# Patient Record
Sex: Male | Born: 1940 | Race: White | Hispanic: No | Marital: Married | Smoking: Former smoker
Health system: Southern US, Community
[De-identification: ages and names within clinical notes are randomized; demographics above are authoritative.]

## PROBLEM LIST (undated history)

## (undated) DIAGNOSIS — I10 Essential (primary) hypertension: Secondary | ICD-10-CM

## (undated) DIAGNOSIS — E7800 Pure hypercholesterolemia, unspecified: Secondary | ICD-10-CM

## (undated) DIAGNOSIS — C61 Malignant neoplasm of prostate: Secondary | ICD-10-CM

## (undated) DIAGNOSIS — N182 Chronic kidney disease, stage 2 (mild): Secondary | ICD-10-CM

## (undated) HISTORY — DX: Malignant neoplasm of prostate: C61

## (undated) HISTORY — DX: Chronic kidney disease, stage 2 (mild): N18.2

## (undated) HISTORY — PX: HERNIA REPAIR: SHX51

## (undated) HISTORY — PX: CATARACT EXTRACTION: SUR2

## (undated) HISTORY — PX: PROSTATE BIOPSY: SHX241

---

## 2001-06-19 ENCOUNTER — Ambulatory Visit (HOSPITAL_COMMUNITY): Admission: RE | Admit: 2001-06-19 | Discharge: 2001-06-19 | Payer: Self-pay | Admitting: Gastroenterology

## 2009-01-03 ENCOUNTER — Encounter (INDEPENDENT_AMBULATORY_CARE_PROVIDER_SITE_OTHER): Payer: Self-pay | Admitting: General Surgery

## 2009-01-03 ENCOUNTER — Ambulatory Visit (HOSPITAL_COMMUNITY): Admission: RE | Admit: 2009-01-03 | Discharge: 2009-01-03 | Payer: Self-pay | Admitting: General Surgery

## 2010-07-20 ENCOUNTER — Other Ambulatory Visit: Payer: Self-pay | Admitting: Family Medicine

## 2010-08-29 LAB — COMPREHENSIVE METABOLIC PANEL
ALT: 30 U/L (ref 0–53)
AST: 25 U/L (ref 0–37)
Alkaline Phosphatase: 48 U/L (ref 39–117)
CO2: 28 mEq/L (ref 19–32)
Chloride: 104 mEq/L (ref 96–112)
GFR calc Af Amer: >60 mL/min (ref >60)
GFR calc non Af Amer: >60 mL/min (ref >60)
Glucose, Bld: 117 mg/dL — ABNORMAL HIGH (ref 70–99)
Sodium: 140 mEq/L (ref 135–145)
Total Bilirubin: 0.7 mg/dL (ref 0.3–1.2)

## 2010-08-29 LAB — CBC
Hemoglobin: 13 g/dL (ref 13.0–17.0)
RBC: 4.2 MIL/uL — ABNORMAL LOW (ref 4.22–5.81)
WBC: 7 10*3/uL (ref 4.0–10.5)

## 2010-08-29 LAB — DIFFERENTIAL
Basophils Absolute: 0.1 10*3/uL (ref 0.0–0.1)
Basophils Relative: 1 % (ref 0–1)
Eosinophils Absolute: 0.3 10*3/uL (ref 0.0–0.7)
Eosinophils Relative: 5 % (ref 0–5)
Lymphs Abs: 1.5 10*3/uL (ref 0.7–4.0)
Neutrophils Relative %: 67 % (ref 43–77)

## 2010-08-29 LAB — PROTIME-INR: Prothrombin Time: 13.6 seconds (ref 11.6–15.2)

## 2010-10-06 NOTE — Op Note (Signed)
NAME:  JAMARL, PEW NO.:  [id number]   MEDICAL RECORD NO.:  [id number]          PATIENT TYPE:  AMB   LOCATION:  DAY                          FACILITY:  [facility]   PHYSICIAN:  Adolph Pollack, M.D.DATE OF BIRTH:  [birth year]   DATE OF PROCEDURE:  01/03/2009  DATE OF DISCHARGE:                               OPERATIVE REPORT   PREOPERATIVE DIAGNOSIS:  Umbilical hernia.   POSTOPERATIVE DIAGNOSIS:  Umbilical hernia.   PROCEDURE:  Umbilical hernia repair with mesh.   SURGEON:  Adolph Pollack, M.D.   ANESTHESIA:  General plus Marcaine local.   INDICATIONS:  This is a 70 year old male who has had a progressively  enlarging umbilical hernia that is somewhat uncomfortable at times.  He  now presents for repair.  The procedure risks and aftercare were  discussed with him preoperatively.   PROCEDURE IN DETAIL:  He was brought to the operating room, placed  supine on the operating table, general anesthetic was administered.  Hair on the abdominal wall was clipped and the area was sterilely  prepped and draped.  I was able to reduce the hernia.  I injected  Marcaine superficially and deep in the periumbilical area.  A  curvilinear subumbilical incision was made through the skin,  subcutaneous tissue and fascia.  I identified the umbilical stalk and  the hernia involving it.  There was also firm hernia content fairly  adherent to the subcutaneous tissue near the umbilicus.  Using blunt  dissection I encircled the umbilical stalk and dissected it free from  the fascia exposing the hernia which measured about 2.5 to 3 cm.  I then  noticed that part of the hernia content and sac were fairly adherent to  the umbilical dermal tissue and dissected this free from that.  There  was a fairly firm mass present and it appeared to be somewhat lipomatous  in nature but also was fibrotic.  I excised some of the hernia sac as  well and sent both the hernia sac and this hard nodule  (hernia contents)  to pathology.   Following this I then raised subcutaneous flaps all around the area of  the hernia to allow for a 3 cm overlap of the mesh I was planning to  place.  I then primarily repaired the hernia under no tension with  interrupted zero Surgilon sutures leaving them long.  A piece of  polypropylene mesh was brought into the field and cut so I would have 3  cm of overlap in all directions.  The primary repair sutures were then  threaded through the mesh and tied down anchoring the mesh directly over  the primary repair.  Following this I then anchored the mesh to the  fascia around the primary repair with a running zero Prolene suture.  This provided for good overlap of the mesh.   I then irrigated the wound and hemostasis was adequate.  Local  anesthetic was infiltrated to the fascia.  The umbilicus was implanted  on the mesh with a 3-0 Vicryl suture.  The subcutaneous tissue was  closed over the mesh with  a running 3-0 Vicryl suture.  The skin was  closed with a running 4-0 Monocryl subcuticular stitch.  Steri-Strips  and sterile dressings were applied.   He tolerated the procedure well without any apparent complications and  was taken to recovery in satisfactory condition.      Adolph Pollack, M.D.  Electronically Signed     TJR/MEDQ  D:  01/03/2009  T:  01/03/2009  Job:  [id number]   cc:   Molly Maduro L. Foy [place], M.D.  Fax: [phone]

## 2010-10-09 NOTE — Procedures (Signed)
[facility]  Patient:    PINK, MAYE Visit Number: [id number] MRN: [id number]          Service Type: END Location: ENDO Attending Physician:  Louie Bun Dictated by:   Everardo All Madilyn Fireman, M.D. Proc. Date: 06/19/01 Admit Date:  06/19/2001   CC:         Marinda Elk, M.D.   Procedure Report  PROCEDURE:  Colonoscopy.  SURGEON:  John C. Madilyn Fireman, M.D.  INDICATIONS FOR PROCEDURE:  A 70 year old patient with no prior colon screening.  Referred for screening colonoscopy.  DESCRIPTION OF PROCEDURE:  The patient was placed in the left lateral decubitus position and placed on the pulse monitor with continuous low flow oxygen delivered by nasal cannula.  He was sedated with 60 mg of IV Demerol and 6 mg of IV Versed.  The Olympus video colonoscope was inserted into the rectum and advanced to the cecum, confirmed by transillumination of McBurneys point, and visualization of the ileocecal valve and appendiceal orifice.  The prep was excellent.  The cecum, ascending, transverse, descending, and sigmoid colon all appeared normal with no masses, polyps, diverticula, or other mucosal abnormalities.  The rectum likewise appeared normal.  Retroflexed view of the anus revealed no obvious internal hemorrhoids.  The colonoscope was then withdrawn and the patient returned to the recovery room in stable condition.  He tolerated the procedure well and there were no immediate complications.  IMPRESSION:  Essentially normal colonoscopy.  PLAN:  Consider repeat flexible sigmoidoscopy in five years and colonoscopy in 10 years. Dictated by:   Everardo All Madilyn Fireman, M.D. Attending Physician:  Louie Bun DD:  06/19/01 TD:  06/19/01 Job: [id number]

## 2011-07-14 ENCOUNTER — Other Ambulatory Visit: Payer: Self-pay | Admitting: Gastroenterology

## 2012-10-05 ENCOUNTER — Other Ambulatory Visit: Payer: Self-pay | Admitting: Family Medicine

## 2013-03-22 ENCOUNTER — Emergency Department (HOSPITAL_BASED_OUTPATIENT_CLINIC_OR_DEPARTMENT_OTHER)
Admission: EM | Admit: 2013-03-22 | Discharge: 2013-03-22 | Disposition: A | Discharge status: Home / Self Care | Payer: Medicare Other | Attending: Emergency Medicine | Admitting: Emergency Medicine

## 2013-03-22 ENCOUNTER — Encounter (HOSPITAL_BASED_OUTPATIENT_CLINIC_OR_DEPARTMENT_OTHER): Payer: Self-pay | Admitting: Emergency Medicine

## 2013-03-22 DIAGNOSIS — E7800 Pure hypercholesterolemia, unspecified: Secondary | ICD-10-CM | POA: Insufficient documentation

## 2013-03-22 DIAGNOSIS — I10 Essential (primary) hypertension: Secondary | ICD-10-CM | POA: Insufficient documentation

## 2013-03-22 DIAGNOSIS — R040 Epistaxis: Secondary | ICD-10-CM | POA: Insufficient documentation

## 2013-03-22 DIAGNOSIS — Z79899 Other long term (current) drug therapy: Secondary | ICD-10-CM | POA: Insufficient documentation

## 2013-03-22 HISTORY — DX: Essential (primary) hypertension: I10

## 2013-03-22 HISTORY — DX: Pure hypercholesterolemia, unspecified: E78.00

## 2013-03-22 MED ORDER — OXYMETAZOLINE HCL 0.05 % NA SOLN
NASAL | Status: AC
  Administered 2013-03-22: 07:00:00
  Filled 2013-03-22: qty 15

## 2013-03-22 NOTE — ED Notes (Signed)
MD at bedside. 

## 2013-03-22 NOTE — ED Notes (Signed)
Nosebleed off and on for past few days, denies injury.

## 2013-03-22 NOTE — ED Provider Notes (Signed)
CSN: [id number]     Arrival date & time 03/22/13  0610 History   None    Chief Complaint  Patient presents with  . Epistaxis   (Consider location/radiation/quality/duration/timing/severity/associated sxs/prior Treatment) HPI Comments: Patient is an otherwise healthy 72 year old male who presents to the emergency department with complaints of nosebleed off and on for the past several days. He denies any injury or trauma. It is not take any anticoagulants with the exception of a baby aspirin. He is not on Coumadin orders or xarelto. He denies any other bleeding issues are busy bruising.  Patient is a 72 y.o. male presenting with nosebleeds. The history is provided by the patient.  Epistaxis Location:  R nare Severity:  Moderate Duration:  3 days Timing:  Intermittent Progression:  Worsening Chronicity:  New Context: not anticoagulants and not trauma   Relieved by:  Nothing Worsened by:  Nothing tried   Past Medical History  Diagnosis Date  . Hypertension   . High cholesterol    Past Surgical History  Procedure Laterality Date  . Hernia repair     History reviewed. No pertinent family history. History  Substance Use Topics  . Smoking status: Never Smoker   . Smokeless tobacco: Not on file  . Alcohol Use: Yes    Review of Systems  HENT: Positive for nosebleeds.   All other systems reviewed and are negative.    Allergies  Review of patient's allergies indicates no known allergies.  Home Medications   Current Outpatient Rx  Name  Route  Sig  Dispense  Refill  . atorvastatin (LIPITOR) 10 MG tablet   Oral   Take 10 mg by mouth daily.         Marland Kitchen olmesartan-hydrochlorothiazide (BENICAR HCT) 40-25 MG per tablet   Oral   Take 1 tablet by mouth daily.          BP 162/87  Pulse 100  Temp(Src) 97.7 F (36.5 C) (Oral)  SpO2 95% Physical Exam  Nursing note and vitals reviewed. Constitutional: He is oriented to person, place, and time. He appears well-developed  and well-nourished. No distress.  HENT:  Head: Normocephalic and atraumatic.  Mouth/Throat: Oropharynx is clear and moist.  There is clotted blood in the right nare but no active bleeding. The left nares is clear.  Neck: Normal range of motion. Neck supple.  Cardiovascular: Normal rate and regular rhythm.   Pulmonary/Chest: Effort normal and breath sounds normal.  Musculoskeletal: Normal range of motion.  Lymphadenopathy:    He has cervical adenopathy.  Neurological: He is alert and oriented to person, place, and time.  Skin: Skin is warm and dry. He is not diaphoretic.    ED Course  Procedures (including critical care time) Labs Review Labs Reviewed - No data to display Imaging Review No results found.  EKG Interpretation   None       MDM  No diagnosis found. He was given Afrin nasal spray and a Vaseline gauze packing was placed. He tolerated this procedure well and hemostasis has been achieved thus far. He was observed for an appropriate period of time and had no further bleeding and appears well. He will be discharged to home with instructions to followup for packing removal in 24 hours. To return as needed if he experiences any problems.    Geoffery Lyons, MD 03/22/13 [phone]

## 2013-03-22 NOTE — ED Notes (Signed)
Packing inserted into right nare by Dr Judd Lien, pt tolerated well. No bleeding at this time.

## 2014-07-02 ENCOUNTER — Other Ambulatory Visit (HOSPITAL_COMMUNITY): Payer: Self-pay | Admitting: Family Medicine

## 2014-07-02 DIAGNOSIS — I739 Peripheral vascular disease, unspecified: Secondary | ICD-10-CM

## 2014-07-05 ENCOUNTER — Ambulatory Visit (HOSPITAL_COMMUNITY)
Admission: RE | Admit: 2014-07-05 | Discharge: 2014-07-05 | Disposition: A | Discharge status: Home / Self Care | Payer: Medicare Other | Source: Ambulatory Visit | Attending: Family Medicine | Admitting: Family Medicine

## 2014-07-05 DIAGNOSIS — Z79899 Other long term (current) drug therapy: Secondary | ICD-10-CM | POA: Diagnosis not present

## 2014-07-05 DIAGNOSIS — I10 Essential (primary) hypertension: Secondary | ICD-10-CM | POA: Diagnosis not present

## 2014-07-05 DIAGNOSIS — E785 Hyperlipidemia, unspecified: Secondary | ICD-10-CM | POA: Insufficient documentation

## 2014-07-05 DIAGNOSIS — I739 Peripheral vascular disease, unspecified: Secondary | ICD-10-CM | POA: Insufficient documentation

## 2014-07-05 NOTE — Progress Notes (Signed)
VASCULAR LAB PRELIMINARY  ARTERIAL  ABI completed:    RIGHT    LEFT    PRESSURE WAVEFORM  PRESSURE WAVEFORM  BRACHIAL 127 Triphasic BRACHIAL 126 Triphasic  DP 150 Triphasic DP 146 Triphasic  PT 147 Triphasic PT 144 Triphasic    RIGHT LEFT  ABI 1.18 1.15   ABIs and Doppler waveforms are within normal limits bilaterally.  Tachina Spoonemore, RVS 07/05/2014, 6:01 PM

## 2015-03-03 ENCOUNTER — Telehealth: Payer: Self-pay | Admitting: Cardiology

## 2015-03-03 NOTE — Telephone Encounter (Signed)
Received records from [facility] for appointment on 04/01/15 with Dr Antoine Poche.  Records given to [facility] (medical records) for Dr Lindaann Slough schedule on 04/01/15. lp

## 2015-04-01 ENCOUNTER — Ambulatory Visit (INDEPENDENT_AMBULATORY_CARE_PROVIDER_SITE_OTHER): Payer: Medicare Other | Admitting: Cardiology

## 2015-04-01 ENCOUNTER — Encounter: Payer: Self-pay | Admitting: Cardiology

## 2015-04-01 VITALS — BP 110/64 | HR 92 | Ht 67.0 in | Wt 254.9 lb

## 2015-04-01 DIAGNOSIS — E663 Overweight: Secondary | ICD-10-CM

## 2015-04-01 DIAGNOSIS — I10 Essential (primary) hypertension: Secondary | ICD-10-CM

## 2015-04-01 DIAGNOSIS — R0602 Shortness of breath: Secondary | ICD-10-CM | POA: Diagnosis not present

## 2015-04-01 NOTE — Patient Instructions (Addendum)
Your physician recommends that you schedule a follow-up appointment As Needed  Your physician has requested that you have an exercise tolerance test. For further information please visit [email or web address]. Please also follow instruction sheet, as given.  Your physician recommends that you have a Coronary Calcium Score Test

## 2015-04-01 NOTE — Progress Notes (Signed)
Cardiology Office Note   Date:  04/01/2015   ID:  Ryan Olson, DOB [birth year], MRN [id number]  PCP:  Lenora Boys, MD  Cardiologist:   Rollene Rotunda, MD   Chief Complaint  Patient presents with  . Shortness of Breath      History of Present Illness: Ryan Olson is a 74 y.o. male who presents for evaluation of dyspnea and multiple cardiovascular risk factors. He has no prior cardiac history himself. However, he has multiple brothers with heart disease. He has hypertension and dyslipidemia. He does have some dyspnea with exertion. This happens with moderate exertion. He doesn't have any shortness of breath at rest but denies any PND or orthopnea. He does have any palpitations, presyncope or syncope. He's had no chest pressure, neck or arm discomfort. He has had no edema. He presented for primary risk reduction evaluation and possible screening for his dyspnea.  Past Medical History  Diagnosis Date  . Hypertension   . High cholesterol     Past Surgical History  Procedure Laterality Date  . Hernia repair      umbilical  . Cataract extraction       Current Outpatient Prescriptions  Medication Sig Dispense Refill  . aspirin 81 MG tablet Take 81 mg by mouth daily.    Marland Kitchen atorvastatin (LIPITOR) 10 MG tablet Take 10 mg by mouth daily.    . Multiple Vitamin (MULTIVITAMIN) tablet Take 1 tablet by mouth daily.    Marland Kitchen olmesartan-hydrochlorothiazide (BENICAR HCT) 40-25 MG per tablet Take 1 tablet by mouth daily.    . Omega-3 Fatty Acids (FISH OIL) 1000 MG CAPS Take 1 capsule by mouth daily.    Marland Kitchen zolpidem (AMBIEN) 10 MG tablet Take 10 mg by mouth as needed.  1   No current facility-administered medications for this visit.    Allergies:   Belviq and Qsymia    Social History:  The patient  reports that he has quit smoking. He does not have any smokeless tobacco history on file. He reports that he drinks alcohol. He reports that he does not use illicit drugs.   Family  History:  The patient's family history includes CAD in his brother and brother; Diabetes in his sister; Heart failure in his father; Lymphoma in his brother and brother; Stroke in his mother.    ROS:  Please see the history of present illness.   Otherwise, review of systems are positive for none.   All other systems are reviewed and negative.    PHYSICAL EXAM: VS:  BP 110/64 mmHg  Pulse 92  Ht  (1.702 m)  Wt 254 lb 14.4 oz (115.622 kg)  BMI 39.91 kg/m2 , BMI Body mass index is 39.91 kg/(m^2). GENERAL:  Well appearing HEENT:  Pupils equal round and reactive, fundi not visualized, oral mucosa unremarkable NECK:  No jugular venous distention, waveform within normal limits, carotid upstroke brisk and symmetric, no bruits, no thyromegaly LYMPHATICS:  No cervical, inguinal adenopathy LUNGS:  Clear to auscultation bilaterally BACK:  No CVA tenderness CHEST:  Unremarkable HEART:  PMI not displaced or sustained,S1 and S2 within normal limits, no S3, no S4, no clicks, no rubs, no murmurs ABD:  Flat, positive bowel sounds normal in frequency in pitch, no bruits, no rebound, no guarding, no midline pulsatile mass, no hepatomegaly, no splenomegaly EXT:  2 plus pulses throughout, no edema, no cyanosis no clubbing SKIN:  No rashes no nodules NEURO:  Cranial nerves II through XII grossly intact,  motor grossly intact throughout PSYCH:  Cognitively intact, oriented to person place and time    EKG:  EKG is ordered today. The ekg ordered today demonstrates sinus rhythm, rate 92, axis within normal limits, intervals within normal limits, poor anterior R wave progression.   Recent Labs: No results found for requested labs within last 365 days.    Lipid Panel No results found for: CHOL, TRIG, HDL, CHOLHDL, VLDL, LDLCALC, LDLDIRECT    Wt Readings from Last 3 Encounters:  04/01/15 254 lb 14.4 oz (115.622 kg)      Other studies Reviewed: Additional studies/ records that were reviewed today  include: Office records. Review of the above records demonstrates:  Please see elsewhere in the note.     ASSESSMENT AND PLAN:  DYSPNEA:  This could be an anginal equivalent. The pretest probability of obstructive coronary disease is low moderate but screening stress testing is indicated. I will bring the patient back for a POET (Plain Old Exercise Test). This will allow me to screen for obstructive coronary disease, risk stratify and very importantly provide a prescription for exercise.  He will also have a coronary calcium score.  HTN:  The blood pressure is at target. No change in medications is indicated. We will continue with therapeutic lifestyle changes (TLC).  Of note his blood pressure keeps running low he might get back off on his meds. Affect is cutting them in half currently.  DYSLIPIDEMIA:  We had a long discussion about this. His coronary calcium score is 0 he might not need a statin and I would be happy to discuss this with him.   Current medicines are reviewed at length with the patient today.  The patient does not have concerns regarding medicines.  The following changes have been made:  no change  Labs/ tests ordered today include:   Orders Placed This Encounter  Procedures  . CT Cardiac Scoring  . Exercise Tolerance Test     Disposition:   FU with me as needed.      Signed, Rollene RotundaJames Railey Glad, MD  04/01/2015 2:38 PM    [facility]

## 2015-04-02 ENCOUNTER — Encounter: Payer: Self-pay | Admitting: Cardiology

## 2015-04-03 NOTE — Addendum Note (Signed)
Addended by: Evans LanceSTOVER, Styles Fambro W on: 04/03/2015 04:44 PM   Modules accepted: Orders

## 2015-04-04 ENCOUNTER — Ambulatory Visit (INDEPENDENT_AMBULATORY_CARE_PROVIDER_SITE_OTHER)
Admission: RE | Admit: 2015-04-04 | Discharge: 2015-04-04 | Disposition: A | Discharge status: Home / Self Care | Payer: Medicare Other | Source: Ambulatory Visit | Attending: Cardiology | Admitting: Cardiology

## 2015-04-04 DIAGNOSIS — R0602 Shortness of breath: Secondary | ICD-10-CM

## 2015-05-02 ENCOUNTER — Telehealth (HOSPITAL_COMMUNITY): Payer: Self-pay

## 2015-05-02 NOTE — Telephone Encounter (Signed)
Encounter complete. 

## 2015-05-07 ENCOUNTER — Ambulatory Visit (HOSPITAL_COMMUNITY)
Admission: RE | Admit: 2015-05-07 | Discharge: 2015-05-07 | Disposition: A | Discharge status: Home / Self Care | Payer: Medicare Other | Source: Ambulatory Visit | Attending: Cardiology | Admitting: Cardiology

## 2015-05-07 DIAGNOSIS — R0602 Shortness of breath: Secondary | ICD-10-CM | POA: Diagnosis not present

## 2015-05-07 LAB — EXERCISE TOLERANCE TEST
CHL CUP MPHR: 148 {beats}/min
CHL RATE OF PERCEIVED EXERTION: 16
CSEPEW: 8.6 METS
CSEPHR: 95 %
Exercise duration (min): 7 min
Peak HR: 141 {beats}/min
Rest HR: 100 {beats}/min

## 2016-01-28 IMAGING — CT CT HEART SCORING
1 of 3 series · 10 of 20 positions shown, 13 images · non-contrast
Comparison: None.

CLINICAL DATA: Risk stratification

EXAM:
Coronary Calcium Score
TECHNIQUE: The patient was scanned on a Siemens Sensation 16 slice scanner.
Axial non-contrast 3mm slices were carried out through the heart.
The data set was analyzed on a dedicated work station and scored
using the Agatson method.

[Series 6: st thins for reformat · axial · 0.76mm/px · z∈[+1201,+1330]mm · 10 of 159 slices shown, 13 images]
[im 15/159  vessel]
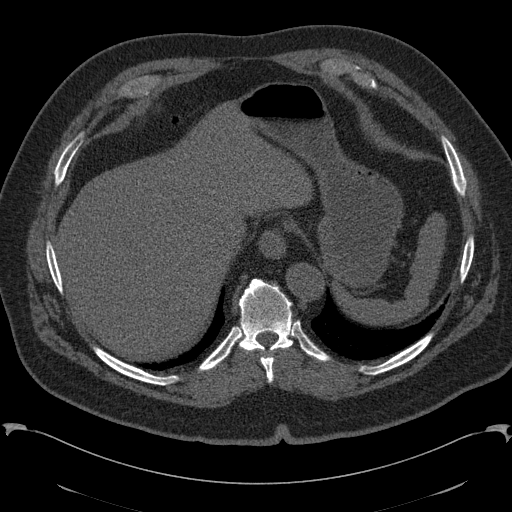
[im 15/159  lung]
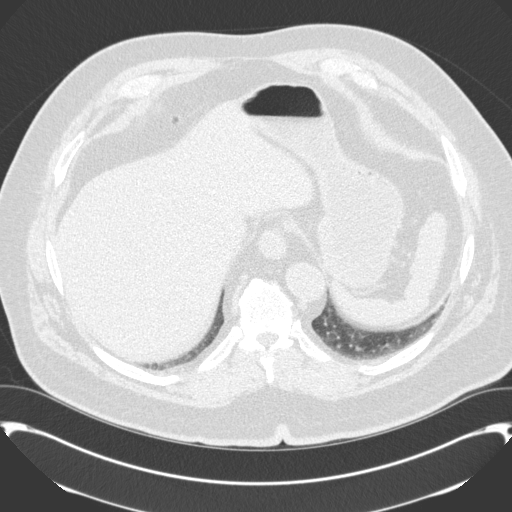
[im 29/159  vessel]
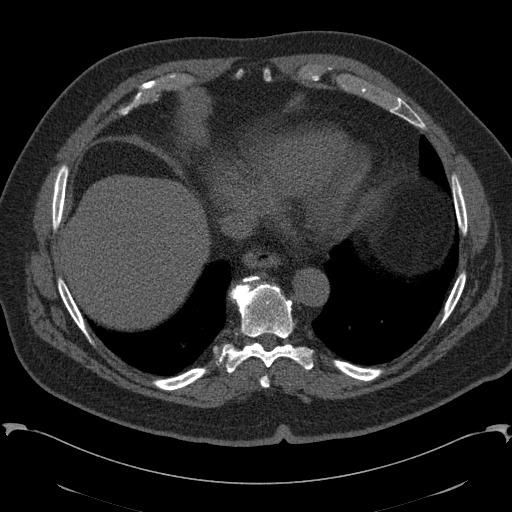
[im 44/159  vessel]
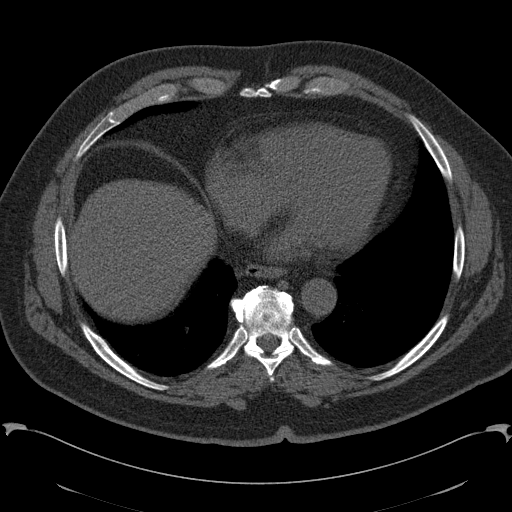
[im 58/159  vessel]
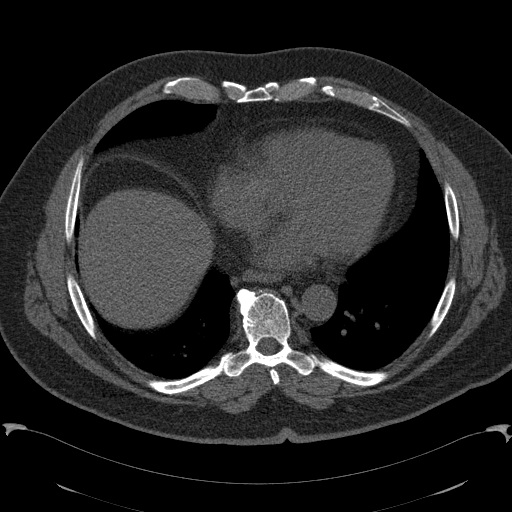
[im 72/159  vessel]
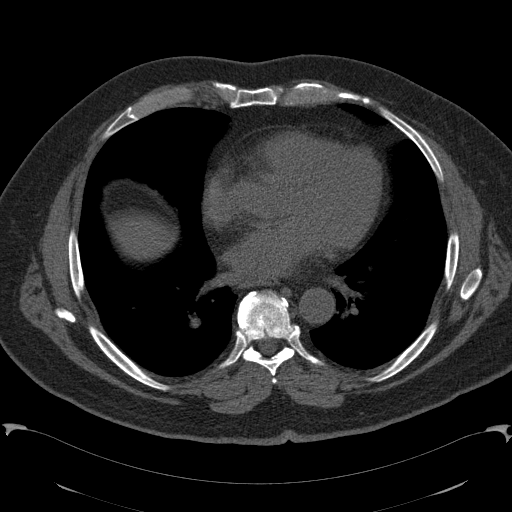
[im 72/159  lung]
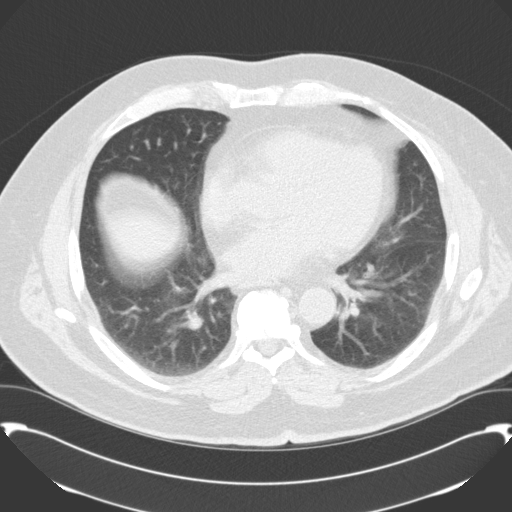
[im 87/159  vessel]
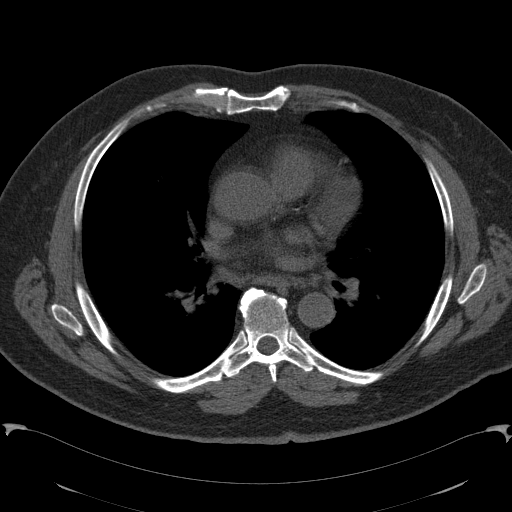
[im 101/159  vessel]
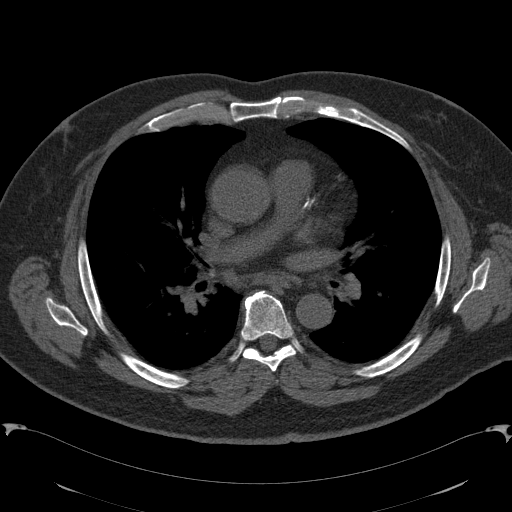
[im 115/159  vessel]
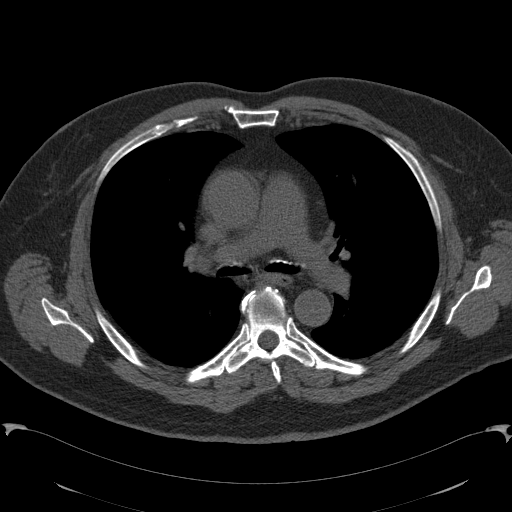
[im 130/159  vessel]
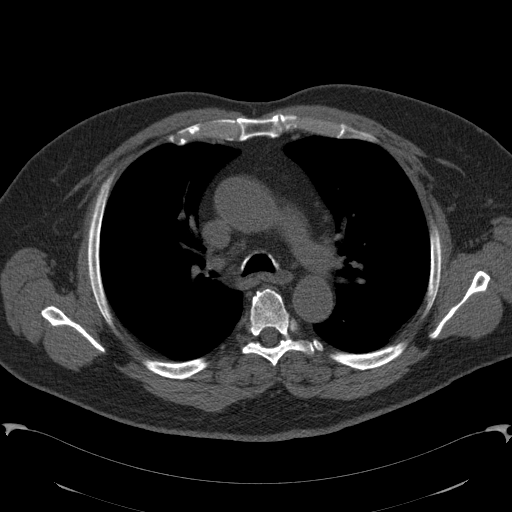
[im 130/159  lung]
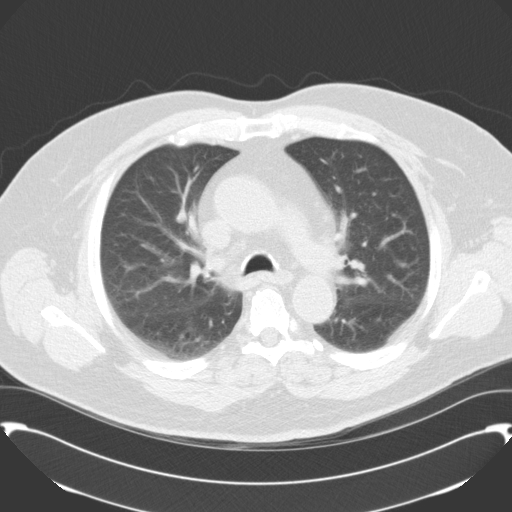
[im 144/159  vessel]
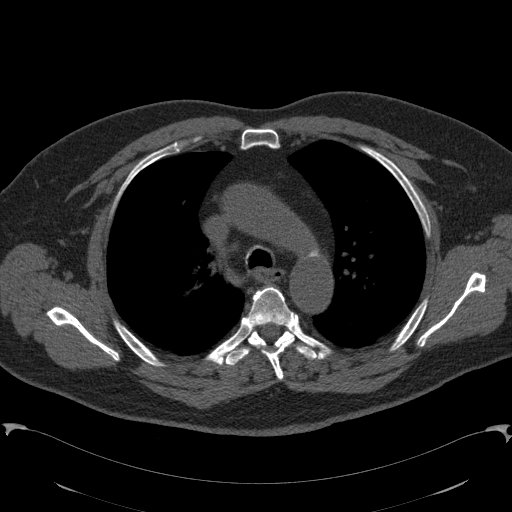

[10 of 20 positions shown; findings below may reference images not displayed]

FINDINGS: Non-cardiac: No significant non cardiac findings on limited lung and
soft tissue windows. See separate report from [REDACTED].

Ascending Aorta:

Pericardium: Normal

Coronary arteries:  Calcium seen in LM, proximal and mid LAD
IMPRESSION: Coronary calcium score of 115. This was 44th percentile for age and
sex matched control.

[employer] [place]

EXAM:
OVER-READ INTERPRETATION  CT CHEST

The following report is an over-read performed by radiologist Dr.
over-read does not include interpretation of cardiac or coronary
anatomy or pathology. The calcium score interpretation by the
cardiologist is attached.
FINDINGS: Limited view of the lung parenchyma is unremarkable. Airways are
normal. Limited view of esophagus and mediastinum unremarkable.
Limited view of the upper abdomen is unremarkable. Limited view of
the chest wall the skeleton demonstrates degenerate spurring spine.
IMPRESSION: No significant extracardiac CT findings.

## 2016-04-26 ENCOUNTER — Ambulatory Visit: Payer: Medicare Other | Admitting: Cardiology

## 2016-05-12 NOTE — Progress Notes (Signed)
Cardiology Office Note   Date:  05/14/2016   ID:  Ryan Olson, DOB 08-Jun-1940, MRN [id number]  PCP:  Joycelyn RuaMEYERS, STEPHEN, MD  Cardiologist:   Rollene RotundaJames Velna Hedgecock, MD   Chief Complaint  Patient presents with  . Coronary Calcium      History of Present Illness: Ryan MainsRichard A Satz is a 75 y.o. male who presents for evaluation of dyspnea and multiple cardiovascular risk factors. I saw him last year.  A POET (Plain Old Exercise Treadmill) demonstrated no ischemia.  However, he did have coronary calcium.  He returns for follow up.  He is doing well since I last saw him.  He has lost 20 lbs.  He is walking 5 days per week.  The patient denies any new symptoms such as chest discomfort, neck or arm discomfort. There has been no new shortness of breath, PND or orthopnea. There have been no reported palpitations, presyncope or syncope.  Past Medical History:  Diagnosis Date  . High cholesterol   . Hypertension     Past Surgical History:  Procedure Laterality Date  . CATARACT EXTRACTION    . HERNIA REPAIR     umbilical     Current Outpatient Prescriptions  Medication Sig Dispense Refill  . aspirin 81 MG tablet Take 81 mg by mouth daily.    Marland Kitchen. atorvastatin (LIPITOR) 10 MG tablet Take 10 mg by mouth daily.    . Multiple Vitamin (MULTIVITAMIN) tablet Take 1 tablet by mouth daily.    Marland Kitchen. zolpidem (AMBIEN) 10 MG tablet Take 10 mg by mouth as needed.  1   No current facility-administered medications for this visit.     Allergies:   Belviq [lorcaserin hcl] and Qsymia [phentermine-topiramate]    ROS:  Please see the history of present illness.   Otherwise, review of systems are positive for none.   All other systems are reviewed and negative.    PHYSICAL EXAM: VS:  BP 122/76   Pulse 81   Ht 5\' 7"  (1.702 m)   Wt 238 lb (108 kg)   BMI 37.28 kg/m  , BMI Body mass index is 37.28 kg/m. GENERAL:  Well appearing NECK:  No jugular venous distention, waveform within normal limits, carotid  upstroke brisk and symmetric, no bruits, no thyromegaly LUNGS:  Clear to auscultation bilaterally BACK:  No CVA tenderness CHEST:  Unremarkable HEART:  PMI not displaced or sustained,S1 and S2 within normal limits, no S3, no S4, no clicks, no rubs, no murmurs ABD:  Flat, positive bowel sounds normal in frequency in pitch, no bruits, no rebound, no guarding, no midline pulsatile mass, no hepatomegaly, no splenomegaly EXT:  2 plus pulses throughout, no edema, no cyanosis no clubbing   EKG:  EKG is ordered today. The ekg ordered today demonstrates sinus rhythm, rate 81, axis within normal limits, intervals within normal limits, poor anterior R wave progression.   Recent Labs: No results found for requested labs within last 8760 hours.     Wt Readings from Last 3 Encounters:  05/14/16 238 lb (108 kg)  04/01/15 254 lb 14.4 oz (115.6 kg)      Other studies Reviewed: Additional studies/ records that were reviewed today include: None Review of the above records demonstrates:     ASSESSMENT AND PLAN:  CORONARY CALCIUM:  He has no symptoms.  He is walking and doing well with lifestyle.   I will plan to see him next year and probably repeat a POET (Plain Old Exercise Treadmill).  He  will continue with weight loss.   (If the patient calls in he can have a POET scheduled before his appt with me.)   HTN:  The blood pressure is at target. No change in medications is indicated. We will continue with therapeutic lifestyle changes (TLC).  Of note he was able to back off on his meds as he lost weight and his BP ran lower.   DYSLIPIDEMIA:  I suggest a goal LDL of 70 and this is followed by Joycelyn RuaMEYERS, STEPHEN, MD.     Current medicines are reviewed at length with the patient today.  The patient does not have concerns regarding medicines.  The following changes have been made:  None  Labs/ tests ordered today include:  None  Orders Placed This Encounter  Procedures  . EKG 12-Lead      Disposition:   FU with me in one year.     Signed, Rollene RotundaJames Rorie Delmore, MD  05/14/2016 8:44 AM    [facility]

## 2016-05-14 ENCOUNTER — Encounter: Payer: Self-pay | Admitting: Cardiology

## 2016-05-14 ENCOUNTER — Ambulatory Visit (INDEPENDENT_AMBULATORY_CARE_PROVIDER_SITE_OTHER): Payer: Medicare Other | Admitting: Cardiology

## 2016-05-14 VITALS — BP 122/76 | HR 81 | Ht 67.0 in | Wt 238.0 lb

## 2016-05-14 DIAGNOSIS — I10 Essential (primary) hypertension: Secondary | ICD-10-CM | POA: Diagnosis not present

## 2016-05-14 DIAGNOSIS — R931 Abnormal findings on diagnostic imaging of heart and coronary circulation: Secondary | ICD-10-CM

## 2016-05-14 NOTE — Patient Instructions (Signed)
Medication Instructions:  Continue current medications  Labwork: None Ordered  Testing/Procedures: None Ordered  Follow-Up: Your physician wants you to follow-up in: 1 Year. You will receive a reminder letter in the mail two months in advance. If you don't receive a letter, please call our office to schedule the follow-up appointment.   Any Other Special Instructions Will Be Listed Below (If Applicable).         HAPPY HOLIDAY   If you need a refill on your cardiac medications before your next appointment, please call your pharmacy.   

## 2017-06-25 NOTE — Progress Notes (Signed)
Cardiology Office Note   Date:  06/27/2017   ID:  [name], DOB [birth year], MRN [id number]  PCP:  Joycelyn Rua, MD  Cardiologist:   Rollene Rotunda, MD   Chief Complaint  Patient presents with  . Coronary Calcium      History of Present Illness: Ryan Olson is a 77 y.o. male who presents for evaluation of dyspnea and multiple cardiovascular risk factors. I saw him last in late 2017.  In 2016 a POET (Plain Old Exercise Treadmill) demonstrated no ischemia.  However, he did have coronary calcium.  He returns for follow up.    Since I last saw him he has done well.  The patient denies any new symptoms such as chest discomfort, neck or arm discomfort. There has been no new shortness of breath, PND or orthopnea. There have been no reported palpitations, presyncope or syncope.  He has been traveling and done a great deal of walking with this.  He had had no problems with this.    Past Medical History:  Diagnosis Date  . High cholesterol   . Hypertension     Past Surgical History:  Procedure Laterality Date  . CATARACT EXTRACTION    . HERNIA REPAIR     umbilical     Current Outpatient Medications  Medication Sig Dispense Refill  . aspirin 81 MG tablet Take 81 mg by mouth daily.    Marland Kitchen atorvastatin (LIPITOR) 10 MG tablet Take 10 mg by mouth daily.    . Multiple Vitamin (MULTIVITAMIN) tablet Take 1 tablet by mouth daily.    Marland Kitchen olmesartan-hydrochlorothiazide (BENICAR HCT) 20-12.5 MG tablet Take 1 tablet by mouth daily.    Marland Kitchen zolpidem (AMBIEN) 10 MG tablet Take 10 mg by mouth as needed.  1   No current facility-administered medications for this visit.     Allergies:   Belviq [lorcaserin hcl] and Qsymia [phentermine-topiramate]    ROS:  Please see the history of present illness.   Otherwise, review of systems are positive for none.   All other systems are reviewed and negative.    PHYSICAL EXAM: VS:  BP 125/80   Pulse 85   Ht 5\' 7"  (1.702 m)   Wt 248 lb  (112.5 kg)   BMI 38.84 kg/m  , BMI Body mass index is 38.84 kg/m.  GENERAL:  Well appearing NECK:  No jugular venous distention, waveform within normal limits, carotid upstroke brisk and symmetric, no bruits, no thyromegaly LUNGS:  Clear to auscultation bilaterally CHEST:  Unremarkable HEART:  PMI not displaced or sustained,S1 and S2 within normal limits, no S3, no S4, no clicks, no rubs, no murmurs ABD:  Flat, positive bowel sounds normal in frequency in pitch, no bruits, no rebound, no guarding, no midline pulsatile mass, no hepatomegaly, no splenomegaly EXT:  2 plus pulses throughout, no edema, no cyanosis no clubbing   EKG:  EKG is  ordered today. The ekg ordered today demonstrates sinus rhythm, rate 85, axis within normal limits, intervals within normal limits, poor anterior R wave progression.   Recent Labs: No results found for requested labs within last 8760 hours.     Wt Readings from Last 3 Encounters:  06/27/17 248 lb (112.5 kg)  05/14/16 238 lb (108 kg)  04/01/15 254 lb 14.4 oz (115.6 kg)      Other studies Reviewed: Additional studies/ records that were reviewed today include: Labs Review of the above records demonstrates:     ASSESSMENT AND PLAN:  CORONARY CALCIUM:  I will bring the patient back for a POET (Plain Old Exercise Test). This will allow me to screen for obstructive coronary disease, risk stratify and very importantly provide a prescription for exercise.  HTN:  The blood pressure is at target. No change in medications is indicated. We will continue with therapeutic lifestyle changes (TLC).  DYSLIPIDEMIA: LDL 75 with an HDL of 54 in October.  He will continue the meds as listed.   Current medicines are reviewed at length with the patient today.  The patient does not have concerns regarding medicines.  The following changes have been made:  None  Labs/ tests ordered today include:    Orders Placed This Encounter  Procedures  . Exercise  Tolerance Test  . EKG 12-Lead     Disposition:   FU with me in one year.     Signed, Rollene RotundaJames Marybeth Dandy, MD  06/27/2017 9:01 AM    [facility]

## 2017-06-27 ENCOUNTER — Ambulatory Visit (INDEPENDENT_AMBULATORY_CARE_PROVIDER_SITE_OTHER): Payer: Medicare Other | Admitting: Cardiology

## 2017-06-27 ENCOUNTER — Encounter: Payer: Self-pay | Admitting: Cardiology

## 2017-06-27 VITALS — BP 125/80 | HR 85 | Ht 67.0 in | Wt 248.0 lb

## 2017-06-27 DIAGNOSIS — E785 Hyperlipidemia, unspecified: Secondary | ICD-10-CM

## 2017-06-27 DIAGNOSIS — R931 Abnormal findings on diagnostic imaging of heart and coronary circulation: Secondary | ICD-10-CM | POA: Diagnosis not present

## 2017-06-27 DIAGNOSIS — I10 Essential (primary) hypertension: Secondary | ICD-10-CM | POA: Diagnosis not present

## 2017-06-27 NOTE — Patient Instructions (Signed)
Medication Instructions:  Continue current medications  If you need a refill on your cardiac medications before your next appointment, please call your pharmacy.  Labwork: None Ordered   Testing/Procedures: Your physician has requested that you have an exercise tolerance test. For further information please visit [email or web address]. Please also follow instruction sheet, as given.   Follow-Up: Your physician wants you to follow-up in: 1 Year. You should receive a reminder letter in the mail two months in advance. If you do not receive a letter, please call our office [phone].       Thank you for choosing [facility]!!       

## 2017-06-29 ENCOUNTER — Telehealth (HOSPITAL_COMMUNITY): Payer: Self-pay

## 2017-06-29 NOTE — Telephone Encounter (Signed)
Encounter complete. 

## 2017-07-01 ENCOUNTER — Ambulatory Visit (HOSPITAL_COMMUNITY)
Admission: RE | Admit: 2017-07-01 | Discharge: 2017-07-01 | Disposition: A | Discharge status: Home / Self Care | Payer: Medicare Other | Source: Ambulatory Visit | Attending: Cardiology | Admitting: Cardiology

## 2017-07-01 DIAGNOSIS — R931 Abnormal findings on diagnostic imaging of heart and coronary circulation: Secondary | ICD-10-CM | POA: Diagnosis present

## 2017-07-01 LAB — EXERCISE TOLERANCE TEST
CHL CUP RESTING HR STRESS: 100 {beats}/min
CHL RATE OF PERCEIVED EXERTION: 17
CSEPED: 6 min
CSEPEW: 7.4 METS
Exercise duration (sec): 19 s
MPHR: 146 {beats}/min
Peak HR: 142 {beats}/min
Percent HR: 97 %

## 2018-06-16 ENCOUNTER — Encounter: Payer: Self-pay | Admitting: Cardiology

## 2018-07-04 NOTE — Progress Notes (Signed)
Cardiology Office Note   Date:  07/06/2018   ID:  Ryan Olson, DOB 25-Apr-1941, MRN [id number]  PCP:  Joycelyn Rua, MD  Cardiologist:   Rollene Rotunda, MD    Chief Complaint  Patient presents with  . Coronary Calcium      History of Present Illness: Ryan Olson is a 78 y.o. male who presents for evaluation of dyspnea and multiple cardiovascular risk factors. I saw him last in late 2017.  In 2016 a POET (Plain Old Exercise Treadmill) demonstrated no ischemia.  However, he did have coronary calcium.  He returns for follow up.  Since I last saw him he has done well.  The patient denies any new symptoms such as chest discomfort, neck or arm discomfort. There has been no new shortness of breath, PND or orthopnea. There have been no reported palpitations, presyncope or syncope.  He walks routinely 3 x per week.     Past Medical History:  Diagnosis Date  . High cholesterol   . Hypertension     Past Surgical History:  Procedure Laterality Date  . CATARACT EXTRACTION    . HERNIA REPAIR     umbilical     Current Outpatient Medications  Medication Sig Dispense Refill  . aspirin 81 MG tablet Take 81 mg by mouth daily.    Marland Kitchen atorvastatin (LIPITOR) 10 MG tablet Take 10 mg by mouth daily.    . Multiple Vitamin (MULTIVITAMIN) tablet Take 1 tablet by mouth daily.    Marland Kitchen olmesartan-hydrochlorothiazide (BENICAR HCT) 20-12.5 MG tablet Take 1 tablet by mouth daily.    Marland Kitchen zolpidem (AMBIEN) 10 MG tablet Take 10 mg by mouth as needed.  1   No current facility-administered medications for this visit.     Allergies:   Belviq [lorcaserin hcl] and Qsymia [phentermine-topiramate]    ROS:  Please see the history of present illness.   Otherwise, review of systems are positive for none.   All other systems are reviewed and negative.    PHYSICAL EXAM: VS:  BP 122/60   Pulse 92   Ht 5\' 7"  (1.702 m)   Wt 245 lb 6.4 oz (111.3 kg)   BMI 38.44 kg/m  , BMI Body mass index is 38.44  kg/m.  GENERAL:  Well appearing NECK:  No jugular venous distention, waveform within normal limits, carotid upstroke brisk and symmetric, no bruits, no thyromegaly LUNGS:  Clear to auscultation bilaterally CHEST:  Unremarkable HEART:  PMI not displaced or sustained,S1 and S2 within normal limits, no S3, no S4, no clicks, no rubs, no murmurs ABD:  Flat, positive bowel sounds normal in frequency in pitch, no bruits, no rebound, no guarding, no midline pulsatile mass, no hepatomegaly, no splenomegaly EXT:  2 plus pulses throughout, no edema, no cyanosis no clubbing     EKG:  EKG is  ordered today. The ekg ordered today demonstrates sinus rhythm, rate 92, axis within normal limits, intervals within normal limits, poor anterior R wave progression.   Recent Labs: No results found for requested labs within last 8760 hours.     Wt Readings from Last 3 Encounters:  07/06/18 245 lb 6.4 oz (111.3 kg)  06/27/17 248 lb (112.5 kg)  05/14/16 238 lb (108 kg)      Other studies Reviewed: Additional studies/ records that were reviewed today include: Labs Review of the above records demonstrates:    ASSESSMENT AND PLAN:  CORONARY CALCIUM:    He had a negative POET (Plain Old Exercise  Treadmill) in Feb.    since that he is had no new symptoms.  No further cardiovascular testing is suggested.  He will continue with risk reduction.  HTN:   The blood pressure is at target. No change in medications is indicated. We will continue with therapeutic lifestyle changes (TLC).  DYSLIPIDEMIA: LDL was was 68. Continue current therapy.     Current medicines are reviewed at length with the patient today.  The patient does not have concerns regarding medicines.  The following changes have been made:  None  Labs/ tests ordered today include:  None  Orders Placed This Encounter  Procedures  . EKG 12-Lead     Disposition:   FU with me in 1.5 years.     Signed, Rollene RotundaJames Michaele Amundson, MD  07/06/2018 9:08  AM    [facility]

## 2018-07-06 ENCOUNTER — Encounter (INDEPENDENT_AMBULATORY_CARE_PROVIDER_SITE_OTHER): Payer: Self-pay

## 2018-07-06 ENCOUNTER — Ambulatory Visit (INDEPENDENT_AMBULATORY_CARE_PROVIDER_SITE_OTHER): Payer: Medicare Other | Admitting: Cardiology

## 2018-07-06 ENCOUNTER — Encounter: Payer: Self-pay | Admitting: Cardiology

## 2018-07-06 VITALS — BP 122/60 | HR 92 | Ht 67.0 in | Wt 245.4 lb

## 2018-07-06 DIAGNOSIS — R0602 Shortness of breath: Secondary | ICD-10-CM | POA: Diagnosis not present

## 2018-07-06 DIAGNOSIS — R931 Abnormal findings on diagnostic imaging of heart and coronary circulation: Secondary | ICD-10-CM

## 2018-07-06 NOTE — Patient Instructions (Signed)
Medication Instructions:  Continue current medications  If you need a refill on your cardiac medications before your next appointment, please call your pharmacy.  Labwork: None Ordered   Take the provided lab slips with you to the lab for your blood draw.   When you have your labs (blood work) drawn today and your tests are completely normal, you will receive your results only by MyChart Message (if you have MyChart) -OR-  A paper copy in the mail.  If you have any lab test that is abnormal or we need to change your treatment, we will call you to review these results.  Testing/Procedures: None Ordered  Follow-Up: You will need a follow up appointment in 18 months.  Please call our office 2 months in advance to schedule this appointment.  You may see Dr Hochrein or one of the following Advanced Practice Providers on your designated Care Team:   Rhonda Barrett, PA-C . Kathryn Lawrence, DNP, ANP   At [facility], you and your health needs are our priority.  As part of our continuing mission to provide you with exceptional heart care, we have created designated Provider Care Teams.  These Care Teams include your primary Cardiologist (physician) and Advanced Practice Providers (APPs -  Physician Assistants and Nurse Practitioners) who all work together to provide you with the care you need, when you need it.  Thank you for choosing [facility]!!     

## 2019-11-29 DIAGNOSIS — R931 Abnormal findings on diagnostic imaging of heart and coronary circulation: Secondary | ICD-10-CM | POA: Insufficient documentation

## 2019-11-29 DIAGNOSIS — E785 Hyperlipidemia, unspecified: Secondary | ICD-10-CM | POA: Insufficient documentation

## 2019-11-29 DIAGNOSIS — Z7189 Other specified counseling: Secondary | ICD-10-CM | POA: Insufficient documentation

## 2019-11-29 NOTE — Progress Notes (Signed)
Cardiology Office Note   Date:  11/30/2019   ID:  Ryan Olson, DOB Aug 15, 1940, MRN [id number]  PCP:  Joycelyn Rua, MD  Cardiologist:   Rollene Rotunda, MD    Chief Complaint  Patient presents with  . Coronary Artery Disease      History of Present Illness: Ryan Olson is a 79 y.o. male who presents for evaluation of dyspnea and multiple cardiovascular risk factors. I saw him last in late 2020.  In 2016 and 2019 a POET (Plain Old Exercise Treadmill) demonstrated no ischemia.  However, he did have coronary calcium.  He returns for follow up.   Since I last saw him he has done well.  The patient denies any new symptoms such as chest discomfort, neck or arm discomfort. There has been no new shortness of breath, PND or orthopnea. There have been no reported palpitations, presyncope or syncope.  He walks about 3 - 4 x per week 50 min and denies symptoms.   Past Medical History:  Diagnosis Date  . High cholesterol   . Hypertension     Past Surgical History:  Procedure Laterality Date  . CATARACT EXTRACTION    . HERNIA REPAIR     umbilical     Current Outpatient Medications  Medication Sig Dispense Refill  . aspirin 81 MG tablet Take 81 mg by mouth daily.    Marland Kitchen atorvastatin (LIPITOR) 20 MG tablet Take 1 tablet (20 mg total) by mouth daily. 90 tablet 3  . Multiple Vitamin (MULTIVITAMIN) tablet Take 1 tablet by mouth daily.    Marland Kitchen olmesartan-hydrochlorothiazide (BENICAR HCT) 20-12.5 MG tablet Take 1 tablet by mouth daily.    Marland Kitchen zolpidem (AMBIEN) 10 MG tablet Take 10 mg by mouth as needed.  1   No current facility-administered medications for this visit.    Allergies:   Belviq [lorcaserin hcl] and Qsymia [phentermine-topiramate]    ROS:  Please see the history of present illness.   Otherwise, review of systems are positive for none.   All other systems are reviewed and negative.    PHYSICAL EXAM: VS:  BP 130/72   Pulse 88   Temp 97.9 F (36.6 C)   Ht 5\' 6"   (1.676 m)   Wt 248 lb 6.4 oz (112.7 kg)   SpO2 98%   BMI 40.09 kg/m  , BMI Body mass index is 40.09 kg/m.  GENERAL:  Well appearing NECK:  No jugular venous distention, waveform within normal limits, carotid upstroke brisk and symmetric, no bruits, no thyromegaly LUNGS:  Clear to auscultation bilaterally CHEST:  Unremarkable HEART:  PMI not displaced or sustained,S1 and S2 within normal limits, no S3, no S4, no clicks, no rubs, no murmurs ABD:  Flat, positive bowel sounds normal in frequency in pitch, no bruits, no rebound, no guarding, no midline pulsatile mass, no hepatomegaly, no splenomegaly EXT:  2 plus pulses throughout, no edema, no cyanosis no clubbing   EKG:  EKG is  ordered today. The ekg ordered today demonstrates sinus rhythm, rate 88, axis within normal limits, intervals within normal limits, poor anterior R wave progression.   Recent Labs: No results found for requested labs within last 8760 hours.     Wt Readings from Last 3 Encounters:  11/30/19 248 lb 6.4 oz (112.7 kg)  07/06/18 245 lb 6.4 oz (111.3 kg)  06/27/17 248 lb (112.5 kg)      Other studies Reviewed: Additional studies/ records that were reviewed today include: Labs Review of the  above records demonstrates:  See elsewhere  ASSESSMENT AND PLAN:  CORONARY CALCIUM:    He had a negative POET (Plain Old Exercise Treadmill) in 2020.    He exercises routinely.  He has absolutely no symptoms.  No further testing this year.  HTN:   The blood pressure is at target.  No change in therapy.   DYSLIPIDEMIA: LDL was 73.  I would like this to at least be less than 70.  I will increase his Lipitor to 20 mg daily and get a lipid profile liver enzymes in about 8 weeks.  We talked about diet and exercise.  COVID EDUCATION: He has been vaccinated.  Current medicines are reviewed at length with the patient today.  The patient does not have concerns regarding medicines.  The following changes have been made:  As  above  Labs/ tests ordered today include:  None  Orders Placed This Encounter  Procedures  . Hepatic function panel  . Lipid panel  . EKG 12-Lead     Disposition:   FU with me in 1 year.     Signed, Rollene Rotunda, MD  11/30/2019 8:20 AM    [facility]

## 2019-11-30 ENCOUNTER — Telehealth: Payer: Self-pay | Admitting: Cardiology

## 2019-11-30 ENCOUNTER — Ambulatory Visit (INDEPENDENT_AMBULATORY_CARE_PROVIDER_SITE_OTHER): Payer: Medicare Other | Admitting: Cardiology

## 2019-11-30 ENCOUNTER — Encounter: Payer: Self-pay | Admitting: Cardiology

## 2019-11-30 ENCOUNTER — Other Ambulatory Visit: Payer: Self-pay

## 2019-11-30 VITALS — BP 130/72 | HR 88 | Temp 97.9°F | Ht 66.0 in | Wt 248.4 lb

## 2019-11-30 DIAGNOSIS — Z7189 Other specified counseling: Secondary | ICD-10-CM

## 2019-11-30 DIAGNOSIS — I10 Essential (primary) hypertension: Secondary | ICD-10-CM

## 2019-11-30 DIAGNOSIS — E785 Hyperlipidemia, unspecified: Secondary | ICD-10-CM

## 2019-11-30 DIAGNOSIS — R931 Abnormal findings on diagnostic imaging of heart and coronary circulation: Secondary | ICD-10-CM

## 2019-11-30 MED ORDER — ATORVASTATIN CALCIUM 20 MG PO TABS: 20.0000 mg | ORAL_TABLET | Freq: Every day | ORAL | 3 refills | Status: DC

## 2019-11-30 NOTE — Telephone Encounter (Signed)
rx sent to correct pharmacy

## 2019-11-30 NOTE — Telephone Encounter (Signed)
Pt c/o medication issue:  1. Name of Medication: atorvastatin (LIPITOR) 20 MG tablet  2. How are you currently taking this medication (dosage and times per day)? As written  3. Are you having a reaction (difficulty breathing--STAT)? No  4. What is your medication issue? Medication sent to wrong pharmacy. Please send to [id number] - [place], [place] - [address]

## 2019-11-30 NOTE — Patient Instructions (Signed)
Medication Instructions:  INCREASE the Atorvastatin (Lipitor) to 20 mg once daily  *If you need a refill on your cardiac medications before your next appointment, please call your pharmacy*   Lab Work: Your provider would like for you to return in 2 months to have the following labs drawn: Fasting Lipid and Liver. You do not need an appointment for the lab. Once in our office lobby there is a podium where you can sign in and ring the doorbell to alert [place] that you are here. The lab is open from 8:00 am to 4:30 pm; closed for lunch from 12:45pm-1:45pm.  If you have labs (blood work) drawn today and your tests are completely normal, you will receive your results only by: Marland Kitchen MyChart Message (if you have MyChart) OR . A paper copy in the mail If you have any lab test that is abnormal or we need to change your treatment, we will call you to review the results.   Testing/Procedures: None ordered   Follow-Up: At [facility], you and your health needs are our priority.  As part of our continuing mission to provide you with exceptional heart care, we have created designated Provider Care Teams.  These Care Teams include your primary Cardiologist (physician) and Advanced Practice Providers (APPs -  Physician Assistants and Nurse Practitioners) who all work together to provide you with the care you need, when you need it.  We recommend signing up for the patient portal called "MyChart".  Sign up information is provided on this After Visit Summary.  MyChart is used to connect with patients for Virtual Visits (Telemedicine).  Patients are able to view lab/test results, encounter notes, upcoming appointments, etc.  Non-urgent messages can be sent to your provider as well.   To learn more about what you can do with MyChart, go to [email or web address].    Your next appointment:   12 month(s)  The format for your next appointment:   In Person  Provider:   You may see Dr. Antoine Poche or one of the  following Advanced Practice Providers on your designated Care Team:    Theodore Demark, PA-C  Joni Reining, DNP, ANP  Cadence Fransico Michael, PA-C

## 2020-01-25 LAB — LIPID PANEL
Chol/HDL Ratio: 4 ratio (ref 0.0–5.0)
Cholesterol, Total: 177 mg/dL (ref 100–199)
HDL: 44 mg/dL (ref >39)
LDL Chol Calc (NIH): 64 mg/dL (ref 0–99)
Triglycerides: 450 mg/dL — ABNORMAL HIGH (ref 0–149)
VLDL Cholesterol Cal: 69 mg/dL — ABNORMAL HIGH (ref 5–40)

## 2020-01-25 LAB — HEPATIC FUNCTION PANEL
ALT: 21 IU/L (ref 0–44)
AST: 16 IU/L (ref 0–40)
Albumin: 4.3 g/dL (ref 3.7–4.7)
Alkaline Phosphatase: 79 IU/L (ref 48–121)
Bilirubin Total: 0.3 mg/dL (ref 0.0–1.2)
Bilirubin, Direct: 0.12 mg/dL (ref 0.00–0.40)
Total Protein: 7.2 g/dL (ref 6.0–8.5)

## 2020-02-06 ENCOUNTER — Encounter: Payer: Self-pay | Admitting: Cardiology

## 2021-01-05 DIAGNOSIS — I2510 Atherosclerotic heart disease of native coronary artery without angina pectoris: Secondary | ICD-10-CM | POA: Insufficient documentation

## 2021-01-05 NOTE — Progress Notes (Signed)
Cardiology Office Note   Date:  01/06/2021   ID:  Ryan Olson, DOB 1940/07/09, MRN [id number]  PCP:  Joycelyn Rua, MD  Cardiologist:   Rollene Rotunda, MD    Chief Complaint  Patient presents with   Muscle aches       History of Present Illness: Ryan Olson is a 80 y.o. male who presents for evaluation of dyspnea and multiple cardiovascular risk factors. I saw him last in late 2020.  In 2016 and 2019 a POET (Plain Old Exercise Treadmill) demonstrated no ischemia.  However, he did have coronary calcium.  He returns for follow up.   Since I last saw him he has done well.  He had a lot of muscle aches which the first was related to the shingles vaccine that he got.  Later he figured out it was related to his Lipitor.  He has been switched to Crestor and his muscle aches have improved.  This was in June when he was switched and he has not yet had a lipid profile on the Crestor.  He has been doing some walking 1/2-hour a day.  He has lost about 15 pounds.  He denies any cardiovascular symptoms. The patient denies any new symptoms such as chest discomfort, neck or arm discomfort. There has been no new shortness of breath, PND or orthopnea. There have been no reported palpitations, presyncope or syncope.    Past Medical History:  Diagnosis Date   High cholesterol    Hypertension     Past Surgical History:  Procedure Laterality Date   CATARACT EXTRACTION     HERNIA REPAIR     umbilical     Current Outpatient Medications  Medication Sig Dispense Refill   aspirin 81 MG tablet Take 81 mg by mouth daily.     Multiple Vitamin (MULTIVITAMIN) tablet Take 1 tablet by mouth daily.     olmesartan (BENICAR) 20 MG tablet Take 20 mg by mouth daily.     rosuvastatin (CRESTOR) 10 MG tablet Take 10 mg by mouth daily.     zolpidem (AMBIEN) 5 MG tablet Take 5 mg by mouth at bedtime as needed.     No current facility-administered medications for this visit.    Allergies:    Belviq [lorcaserin hcl] and Qsymia [phentermine-topiramate]    ROS:  Please see the history of present illness.   Otherwise, review of systems are positive for none.   All other systems are reviewed and negative.    PHYSICAL EXAM: VS:  BP 128/74   Pulse 90   Ht 5\' 7"  (1.702 m)   Wt 234 lb (106.1 kg)   SpO2 96%   BMI 36.65 kg/m  , BMI Body mass index is 36.65 kg/m.  GENERAL:  Well appearing NECK:  No jugular venous distention, waveform within normal limits, carotid upstroke brisk and symmetric, no bruits, no thyromegaly LUNGS:  Clear to auscultation bilaterally CHEST:  Unremarkable HEART:  PMI not displaced or sustained,S1 and S2 within normal limits, no S3, no S4, no clicks, no rubs, no murmurs ABD:  Flat, positive bowel sounds normal in frequency in pitch, no bruits, no rebound, no guarding, no midline pulsatile mass, no hepatomegaly, no splenomegaly EXT:  2 plus pulses throughout, no edema, no cyanosis no clubbing   EKG:  EKG is  ordered today. The ekg ordered today demonstrates sinus rhythm, rate 90, axis within normal limits, intervals within normal limits, poor anterior R wave progression.   Recent Labs: 01/25/2020:  ALT 21     Wt Readings from Last 3 Encounters:  01/06/21 234 lb (106.1 kg)  11/30/19 248 lb 6.4 oz (112.7 kg)  07/06/18 245 lb 6.4 oz (111.3 kg)      Other studies Reviewed: Additional studies/ records that were reviewed today include: Labs Review of the above records demonstrates: See elsewhere  ASSESSMENT AND PLAN:  CORONARY CALCIUM:    He had a negative POET (Plain Old Exercise Treadmill) in 2020.    We talked about this at length.  His score was 44th percentile in 2016.  He is doing all the risk reduction and I think is reasonable and he is not having any symptoms.  I would not suggest further cardiovascular testing.  We talked about having awareness of symptoms should he have an onset of something like chest discomfort exercise intolerance or  shortness of breath he would let me know.  HTN:   The blood pressure is well controlled.  No change in therapy.   DYSLIPIDEMIA:    He is going to ask his primary care doctor to check a lipid profile about 10 weeks after he has started his Crestor.  I have suggested a goal LDL less than 70.   Current medicines are reviewed at length with the patient today.  The patient does not have concerns regarding medicines.  The following changes have been made:  None  Labs/ tests ordered today include:  None  Orders Placed This Encounter  Procedures   EKG 12-Lead      Disposition:   FU with me as needed.     Signed, Rollene Rotunda, MD  01/06/2021 3:56 PM    [facility]

## 2021-01-06 ENCOUNTER — Ambulatory Visit (INDEPENDENT_AMBULATORY_CARE_PROVIDER_SITE_OTHER): Payer: 59 | Admitting: Cardiology

## 2021-01-06 ENCOUNTER — Encounter: Payer: Self-pay | Admitting: Cardiology

## 2021-01-06 ENCOUNTER — Other Ambulatory Visit: Payer: Self-pay

## 2021-01-06 VITALS — BP 128/74 | HR 90 | Ht 67.0 in | Wt 234.0 lb

## 2021-01-06 DIAGNOSIS — I10 Essential (primary) hypertension: Secondary | ICD-10-CM | POA: Diagnosis not present

## 2021-01-06 DIAGNOSIS — E785 Hyperlipidemia, unspecified: Secondary | ICD-10-CM

## 2021-01-06 DIAGNOSIS — R931 Abnormal findings on diagnostic imaging of heart and coronary circulation: Secondary | ICD-10-CM

## 2021-01-06 NOTE — Patient Instructions (Signed)
Medication Instructions:  No Changes In Medications at this time. *If you need a refill on your cardiac medications before your next appointment, please call your pharmacy*  Follow-Up: At [facility], you and your health needs are our priority.  As part of our continuing mission to provide you with exceptional heart care, we have created designated Provider Care Teams.  These Care Teams include your primary Cardiologist (physician) and Advanced Practice Providers (APPs -  Physician Assistants and Nurse Practitioners) who all work together to provide you with the care you need, when you need it.  We recommend signing up for the patient portal called "MyChart".  Sign up information is provided on this After Visit Summary.  MyChart is used to connect with patients for Virtual Visits (Telemedicine).  Patients are able to view lab/test results, encounter notes, upcoming appointments, etc.  Non-urgent messages can be sent to your provider as well.   To learn more about what you can do with MyChart, go to [email or web address].    Your next appointment:   AS NEEDED   The format for your next appointment:   In Person  Provider:   James Hochrein, MD  

## 2023-10-03 ENCOUNTER — Other Ambulatory Visit: Payer: Self-pay | Admitting: Urology

## 2023-10-03 DIAGNOSIS — N402 Nodular prostate without lower urinary tract symptoms: Secondary | ICD-10-CM

## 2023-10-03 DIAGNOSIS — R972 Elevated prostate specific antigen [PSA]: Secondary | ICD-10-CM

## 2023-11-06 ENCOUNTER — Ambulatory Visit
Admission: RE | Admit: 2023-11-06 | Discharge: 2023-11-06 | Disposition: A | Discharge status: Home / Self Care | Source: Ambulatory Visit | Attending: Urology | Admitting: Urology

## 2023-11-06 DIAGNOSIS — R972 Elevated prostate specific antigen [PSA]: Secondary | ICD-10-CM

## 2023-11-06 DIAGNOSIS — N402 Nodular prostate without lower urinary tract symptoms: Secondary | ICD-10-CM

## 2023-11-06 MED ORDER — GADOPICLENOL 0.5 MMOL/ML IV SOLN
10.0000 mL | Freq: Once | INTRAVENOUS | Status: AC | PRN
  Administered 2023-11-06: 10 mL via INTRAVENOUS

## 2023-11-23 ENCOUNTER — Other Ambulatory Visit: Payer: Self-pay | Admitting: Nephrology

## 2023-11-23 DIAGNOSIS — N1831 Chronic kidney disease, stage 3a: Secondary | ICD-10-CM

## 2023-11-24 ENCOUNTER — Ambulatory Visit
Admission: RE | Admit: 2023-11-24 | Discharge: 2023-11-24 | Disposition: A | Discharge status: Home / Self Care | Source: Ambulatory Visit | Attending: Nephrology | Admitting: Nephrology

## 2023-11-24 DIAGNOSIS — N1831 Chronic kidney disease, stage 3a: Secondary | ICD-10-CM

## 2024-01-10 ENCOUNTER — Encounter (HOSPITAL_BASED_OUTPATIENT_CLINIC_OR_DEPARTMENT_OTHER): Payer: Self-pay

## 2024-01-10 ENCOUNTER — Other Ambulatory Visit: Payer: Self-pay

## 2024-01-10 ENCOUNTER — Emergency Department (HOSPITAL_BASED_OUTPATIENT_CLINIC_OR_DEPARTMENT_OTHER)

## 2024-01-10 ENCOUNTER — Emergency Department (HOSPITAL_BASED_OUTPATIENT_CLINIC_OR_DEPARTMENT_OTHER): Admission: EM | Admit: 2024-01-10 | Discharge: 2024-01-10 | Disposition: A | Discharge status: Home / Self Care

## 2024-01-10 DIAGNOSIS — Z79899 Other long term (current) drug therapy: Secondary | ICD-10-CM | POA: Insufficient documentation

## 2024-01-10 DIAGNOSIS — R42 Dizziness and giddiness: Secondary | ICD-10-CM | POA: Diagnosis present

## 2024-01-10 DIAGNOSIS — Z7982 Long term (current) use of aspirin: Secondary | ICD-10-CM | POA: Insufficient documentation

## 2024-01-10 DIAGNOSIS — I10 Essential (primary) hypertension: Secondary | ICD-10-CM | POA: Insufficient documentation

## 2024-01-10 LAB — URINALYSIS, ROUTINE W REFLEX MICROSCOPIC
Bilirubin Urine: NEGATIVE
Glucose, UA: NEGATIVE mg/dL
Ketones, ur: NEGATIVE mg/dL
Leukocytes,Ua: NEGATIVE
Nitrite: NEGATIVE
Protein, ur: 30 mg/dL — AB
Specific Gravity, Urine: 1.01 (ref 1.005–1.030)
pH: 5 (ref 5.0–8.0)

## 2024-01-10 LAB — COMPREHENSIVE METABOLIC PANEL WITH GFR
ALT: 16 U/L (ref 0–44)
AST: 19 U/L (ref 15–41)
Albumin: 4.4 g/dL (ref 3.5–5.0)
Alkaline Phosphatase: 56 U/L (ref 38–126)
Anion gap: 15 (ref 5–15)
BUN: 23 mg/dL (ref 8–23)
CO2: 17 mmol/L — ABNORMAL LOW (ref 22–32)
Calcium: 9.8 mg/dL (ref 8.9–10.3)
Chloride: 106 mmol/L (ref 98–111)
Creatinine, Ser: 1.03 mg/dL (ref 0.61–1.24)
GFR, Estimated: >60 mL/min (ref >60)
Glucose, Bld: 109 mg/dL — ABNORMAL HIGH (ref 70–99)
Potassium: 4.7 mmol/L (ref 3.5–5.1)
Sodium: 138 mmol/L (ref 135–145)
Total Bilirubin: 0.6 mg/dL (ref 0.0–1.2)
Total Protein: 7.4 g/dL (ref 6.5–8.1)

## 2024-01-10 LAB — CBC
HCT: 47.1 % (ref 39.0–52.0)
Hemoglobin: 16.1 g/dL (ref 13.0–17.0)
MCH: 32.3 pg (ref 26.0–34.0)
MCHC: 34.2 g/dL (ref 30.0–36.0)
MCV: 94.6 fL (ref 80.0–100.0)
Platelets: 147 K/uL — ABNORMAL LOW (ref 150–400)
RBC: 4.98 MIL/uL (ref 4.22–5.81)
RDW: 13.4 % (ref 11.5–15.5)
WBC: 7 K/uL (ref 4.0–10.5)
nRBC: 0 % (ref 0.0–0.2)

## 2024-01-10 LAB — URINALYSIS, MICROSCOPIC (REFLEX)
RBC / HPF: >50 RBC/hpf (ref 0–5)
Squamous Epithelial / HPF: NONE SEEN /HPF (ref 0–5)

## 2024-01-10 LAB — CBG MONITORING, ED: Glucose-Capillary: 113 mg/dL — ABNORMAL HIGH (ref 70–99)

## 2024-01-10 MED ORDER — MECLIZINE HCL 25 MG PO TABS: 25.0000 mg | ORAL_TABLET | Freq: Three times a day (TID) | ORAL | 0 refills | Status: AC | PRN

## 2024-01-10 MED ORDER — MECLIZINE HCL 25 MG PO TABS
25.0000 mg | ORAL_TABLET | Freq: Once | ORAL | Status: AC
  Administered 2024-01-10: 25 mg via ORAL
  Filled 2024-01-10: qty 1

## 2024-01-10 MED ORDER — LACTATED RINGERS IV BOLUS
1000.0000 mL | Freq: Once | INTRAVENOUS | Status: AC
  Administered 2024-01-10: 1000 mL via INTRAVENOUS

## 2024-01-10 NOTE — ED Provider Notes (Signed)
 [facility] Provider Note   CSN: [id number] Arrival date & time: 01/10/24  0844     Patient presents with: Dizziness   Ryan Olson is a 83 y.o. male.   83 year old male with past medical history of hypertension, hyperlipidemia, and congenital nystagmus presenting to the emergency department today with dizziness.  Patient states that he woke up with this this morning.  States that he went to bed last night and was in his normal state of health.  He denies any headache.  Denies any focal weakness, numbness, or tingling.  Denies any speech difficulty.  He states that he feels okay at rest but when he walks he does get dizzy.  States that he was walking through his house and had to hold onto the walls when he was walking.  Came to the ER today for further evaluation regarding this due to ongoing symptoms.   Dizziness      Prior to Admission medications   Medication Sig Start Date End Date Taking? Authorizing Provider  meclizine  (ANTIVERT ) 25 MG tablet Take 1 tablet (25 mg total) by mouth 3 (three) times daily as needed for dizziness. 01/10/24  Yes Ula Prentice SAUNDERS, MD  aspirin 81 MG tablet Take 81 mg by mouth daily.    [provider]  Multiple Vitamin (MULTIVITAMIN) tablet Take 1 tablet by mouth daily.    [provider]  olmesartan (BENICAR) 20 MG tablet Take 20 mg by mouth daily. 10/16/20   [provider]  rosuvastatin (CRESTOR) 10 MG tablet Take 10 mg by mouth daily. 10/15/20   [provider]  zolpidem (AMBIEN) 5 MG tablet Take 5 mg by mouth at bedtime as needed. 01/05/21   [provider]    Allergies: Belviq [lorcaserin hcl] and Qsymia [phentermine-topiramate er]    Review of Systems  Neurological:  Positive for dizziness.  All other systems reviewed and are negative.   Updated Vital Signs BP 125/86   Pulse 74   Temp 97.7 F (36.5 C) (Oral)   Resp 19   Ht 5' 7 (1.702 m)   Wt  104.3 kg   SpO2 94%   BMI 36.02 kg/m   Physical Exam Vitals and nursing note reviewed.   Gen: NAD Eyes: PERRL, EOMI, the patient does have left going nystagmus as well as some vertical nystagmus when looking upward HEENT: no oropharyngeal swelling Neck: trachea midline Resp: clear to auscultation bilaterally Card: RRR, no murmurs, rubs, or gallops Abd: nontender, nondistended Extremities: no calf tenderness, no edema Vascular: 2+ radial pulses bilaterally, 2+ DP pulses bilaterally Neuro: Cranial nerves intact, equal strength and sensation throughout bilateral upper and lower extremities, no dysmetria on finger-to-nose testing Skin: no rashes Psyc: acting appropriately   (all labs ordered are listed, but only abnormal results are displayed) Labs Reviewed  COMPREHENSIVE METABOLIC PANEL WITH GFR - Abnormal; Notable for the following components:      Result Value   CO2 17 (*)    Glucose, Bld 109 (*)    All other components within normal limits  CBC - Abnormal; Notable for the following components:   Platelets 147 (*)    All other components within normal limits  URINALYSIS, ROUTINE W REFLEX MICROSCOPIC - Abnormal; Notable for the following components:   Hgb urine dipstick LARGE (*)    Protein, ur 30 (*)    All other components within normal limits  URINALYSIS, MICROSCOPIC (REFLEX) - Abnormal; Notable for the following components:  Bacteria, UA RARE (*)    All other components within normal limits  CBG MONITORING, ED - Abnormal; Notable for the following components:   Glucose-Capillary 113 (*)    All other components within normal limits    EKG: EKG Interpretation Date/Time:  Tuesday January 10 2024 08:59:58 EDT Ventricular Rate:  94 PR Interval:  250 QRS Duration:  118 QT Interval:  378 QTC Calculation: 428 R Axis:   64  Text Interpretation: Sinus rhythm Atrial premature complexes Prolonged PR interval Left atrial enlargement Nonspecific intraventricular conduction  delay Probable anteroseptal infarct, old Confirmed by Ula Barter [phone]) on 01/10/2024 9:17:04 AM  Radiology: CT Head Wo Contrast Result Date: 01/10/2024 EXAM: CT HEAD WITHOUT CONTRAST 01/10/2024 09:26:00 AM TECHNIQUE: CT of the head was performed without the administration of intravenous contrast. Automated exposure control, iterative reconstruction, and/or weight based adjustment of the mA/kV was utilized to reduce the radiation dose to as low as reasonably achievable. COMPARISON: None available. CLINICAL HISTORY: Neuro deficit, acute, stroke suspected. Pt states that when he woke up this morning that he felt weak and that he feels like his balance is off. States that he woke up at 6 am. States that he can walk but he has to hold on to things. States that many years ago he had sit on the side of the bed prior to getting up to prevent dizziness. States that what he is feeling is worse. States that he feels dizzy now if he moves his head too quickly. Pt alert and oriented x 4. No deficits noted. GCS- 15 Neuro intact. NIH-0. FINDINGS: BRAIN AND VENTRICLES: No acute hemorrhage. Gray-white differentiation is preserved. No hydrocephalus. No extra-axial collection. No mass effect or midline shift. There is age-related cerebral volume loss and mild cerebral white matter disease. ORBITS: The patient is status post bilateral lens replacement. SINUSES: No acute abnormality. SOFT TISSUES AND SKULL: No acute soft tissue abnormality. No skull fracture. There are calcifications within the carotid siphons. IMPRESSION: 1. No acute intracranial abnormality. 2. Age-related cerebral volume loss and mild cerebral white matter disease. Electronically signed by: Evalene Coho MD 01/10/2024 09:53 AM EDT RP Workstation: [id number]     Procedures   Medications Ordered in the ED  meclizine  (ANTIVERT ) tablet 25 mg (25 mg Oral Given 01/10/24 0933)  lactated ringers  bolus 1,000 mL (0 mLs Intravenous Stopped 01/10/24 1138)                                     Medical Decision Making 83 year old male with past medical history of hypertension, hyperlipidemia, and congenital nystagmus presenting to the emergency department today with dizziness.  I will further evaluate the patient here with basic labs to evaluate for anemia or electrolyte abnormalities.  Will obtain a CT scan of his head to eval for intracranial hemorrhage or mass lesion.  This may be due to CVA but given the patient's symptoms started over 4 and half hours ago he is not a candidate for acute neurointervention at this time.  I will give the patient meclizine  as well as some IV fluids here in the event this is due to vertigo.  He does have some nystagmus here on exam but does have this congenitally so exam is unreliable here.  I will reevaluate for ultimate disposition.  The patient's workup here is reassuring.  Labs and CT scan are unremarkable for any acute pathology.  The patient had significant improvement  in his symptoms while still having some mild dizziness.  I did discuss going to [facility] for MRI for further evaluation for stroke versus going home with treatment for vertigo since he did have significant improvement.  He ultimately is requesting discharge through shared decision making.  He is encouraged to go to [facility] with worsening symptoms and is in agreement with this plan.  Amount and/or Complexity of Data Reviewed Labs: ordered. Radiology: ordered.        Final diagnoses:  Dizziness    ED Discharge Orders          Ordered    meclizine  (ANTIVERT ) 25 MG tablet  3 times daily PRN        01/10/24 1138               Ula Prentice SAUNDERS, MD 01/10/24 1139

## 2024-01-10 NOTE — ED Notes (Signed)
 Pt d/c home per EDP order. Discharge summary reviewed, pt verbalizes understanding. Off unit via WC, NAD.

## 2024-01-10 NOTE — Discharge Instructions (Signed)
 Your workup today was reassuring.  Please take the meclizine  as needed.  As we discussed, if your symptoms worsen please go to [facility] for further evaluation.

## 2024-01-10 NOTE — ED Triage Notes (Signed)
 Pt states that when he woke up this morning that he felt weak and that he feels like his balance is off. States that he woke up at 6 am. States that he can walk but he has to hold on to things. States that many years ago he had sit on the side of the prior to getting up to prevent dizziness. States that what he is feeling is worse. States that he feels dizzy now if he moves his head to quickly. Pt alert and oriented x 4. No deficits noted. GCS- 15 Neuro intact. NIH-0

## 2024-04-01 NOTE — Progress Notes (Unsigned)
 Cardiology Office Note   Date:  04/01/2024   ID:  Ryan Olson, DOB 08-Jan-1941, MRN [id number]  PCP:  Nanci Senior, MD  Cardiologist:   Lynwood Schilling, MD Referring:  ***  No chief complaint on file.     History of Present Illness: Ryan Olson is a 83 y.o. male who I saw in 2022.  He had cardiovascular risk factors.  In 2016 and 2019 a POET (Plain Old Exercise Treadmill) demonstrated no ischemia.  However, he did have coronary calcium .  He returns for follow up.   ***    ***      who presents for evaluation of dyspnea and multiple cardiovascular risk factors. I saw him last in late 2020.   Since I last saw him he has done well.  He had a lot of muscle aches which the first was related to the shingles vaccine that he got.  Later he figured out it was related to his Lipitor.  He has been switched to Crestor and his muscle aches have improved.  This was in June when he was switched and he has not yet had a lipid profile on the Crestor.  He has been doing some walking 1/2-hour a day.  He has lost about 15 pounds.  He denies any cardiovascular symptoms. The patient denies any new symptoms such as chest discomfort, neck or arm discomfort. There has been no new shortness of breath, PND or orthopnea. There have been no reported palpitations, presyncope or syncope.     Past Medical History:  Diagnosis Date   High cholesterol    Hypertension     Past Surgical History:  Procedure Laterality Date   CATARACT EXTRACTION     HERNIA REPAIR     umbilical   PROSTATE BIOPSY       Current Outpatient Medications  Medication Sig Dispense Refill   aspirin 81 MG tablet Take 81 mg by mouth daily.     meclizine  (ANTIVERT ) 25 MG tablet Take 1 tablet (25 mg total) by mouth 3 (three) times daily as needed for dizziness. 30 tablet 0   Multiple Vitamin (MULTIVITAMIN) tablet Take 1 tablet by mouth daily.     olmesartan (BENICAR) 20 MG tablet Take 20 mg by mouth daily.      rosuvastatin (CRESTOR) 10 MG tablet Take 10 mg by mouth daily.     zolpidem (AMBIEN) 5 MG tablet Take 5 mg by mouth at bedtime as needed.     No current facility-administered medications for this visit.    Allergies:   Belviq [lorcaserin hcl] and Qsymia [phentermine-topiramate er]    Social History:  The patient  reports that he has quit smoking. He has never used smokeless tobacco. He reports current alcohol use. He reports that he does not use drugs.   Family History:  The patient's ***family history includes CAD in his brother and brother; Diabetes in his sister; Heart failure in his father; Lymphoma in his brother and brother; Stroke in his mother.    ROS:  Please see the history of present illness.   Otherwise, review of systems are positive for {NONE DEFAULTED:18576}.   All other systems are reviewed and negative.    PHYSICAL EXAM: VS:  There were no vitals taken for this visit. , BMI There is no height or weight on file to calculate BMI. GENERAL:  Well appearing HEENT:  Pupils equal round and reactive, fundi not visualized, oral mucosa unremarkable NECK:  No jugular venous distention, waveform  within normal limits, carotid upstroke brisk and symmetric, no bruits, no thyromegaly LYMPHATICS:  No cervical, inguinal adenopathy LUNGS:  Clear to auscultation bilaterally BACK:  No CVA tenderness CHEST:  Unremarkable HEART:  PMI not displaced or sustained,S1 and S2 within normal limits, no S3, no S4, no clicks, no rubs, *** murmurs ABD:  Flat, positive bowel sounds normal in frequency in pitch, no bruits, no rebound, no guarding, no midline pulsatile mass, no hepatomegaly, no splenomegaly EXT:  2 plus pulses throughout, no edema, no cyanosis no clubbing SKIN:  No rashes no nodules NEURO:  Cranial nerves II through XII grossly intact, motor grossly intact throughout PSYCH:  Cognitively intact, oriented to person place and time    EKG:        Recent Labs: 01/10/2024: ALT 16; BUN  23; Creatinine, Ser 1.03; Hemoglobin 16.1; Platelets 147; Potassium 4.7; Sodium 138    Lipid Panel    Component Value Date/Time   CHOL 177 01/25/2020 0848   TRIG 450 (H) 01/25/2020 0848   HDL 44 01/25/2020 0848   CHOLHDL 4.0 01/25/2020 0848   LDLCALC 64 01/25/2020 0848      Wt Readings from Last 3 Encounters:  01/10/24 230 lb (104.3 kg)  01/06/21 234 lb (106.1 kg)  11/30/19 248 lb 6.4 oz (112.7 kg)      Other studies Reviewed: Additional studies/ records that were reviewed today include: ***. Review of the above records demonstrates:  Please see elsewhere in the note.  ***   ASSESSMENT AND PLAN:  CORONARY CALCIUM :   ***   He had a negative POET (Plain Old Exercise Treadmill) in 2020.    We talked about this at length.  His score was 44th percentile in 2016.  He is doing all the risk reduction and I think is reasonable and he is not having any symptoms.  I would not suggest further cardiovascular testing.  We talked about having awareness of symptoms should he have an onset of something like chest discomfort exercise intolerance or shortness of breath he would let me know.   HTN:   The blood pressure is ***  well controlled.  No change in therapy.    DYSLIPIDEMIA:    ***  He is going to ask his primary care doctor to check a lipid profile about 10 weeks after he has started his Crestor.  I have suggested a goal LDL less than 70.    Current medicines are reviewed at length with the patient today.  The patient {ACTIONS; HAS/DOES NOT HAVE:19233} concerns regarding medicines.  The following changes have been made:  {PLAN; NO CHANGE:13088:s}  Labs/ tests ordered today include: *** No orders of the defined types were placed in this encounter.    Disposition:   FU with ***    Signed, Lynwood Schilling, MD  04/01/2024 8:41 PM    [facility]

## 2024-04-02 ENCOUNTER — Encounter: Payer: Self-pay | Admitting: Cardiology

## 2024-04-02 ENCOUNTER — Ambulatory Visit: Attending: Cardiology | Admitting: Cardiology

## 2024-04-02 VITALS — BP 130/66 | HR 71 | Resp 16 | Ht 67.0 in | Wt 233.0 lb

## 2024-04-02 DIAGNOSIS — I10 Essential (primary) hypertension: Secondary | ICD-10-CM | POA: Diagnosis not present

## 2024-04-02 DIAGNOSIS — E785 Hyperlipidemia, unspecified: Secondary | ICD-10-CM

## 2024-04-02 DIAGNOSIS — R931 Abnormal findings on diagnostic imaging of heart and coronary circulation: Secondary | ICD-10-CM | POA: Diagnosis not present

## 2024-04-02 DIAGNOSIS — R002 Palpitations: Secondary | ICD-10-CM | POA: Diagnosis not present

## 2024-04-02 NOTE — Patient Instructions (Signed)
 Medication Instructions:  Your physician recommends that you continue on your current medications as directed. Please refer to the Current Medication list given to you today.  *If you need a refill on your cardiac medications before your next appointment, please call your pharmacy*  Lab Work: NONE If you have labs (blood work) drawn today and your tests are completely normal, you will receive your results only by: MyChart Message (if you have MyChart) OR A paper copy in the mail If you have any lab test that is abnormal or we need to change your treatment, we will call you to review the results.  Testing/Procedures: 4 week Event Monitor Your physician has recommended that you wear an event monitor. Event monitors are medical devices that record the heart's electrical activity. Doctors most often us  these monitors to diagnose arrhythmias. Arrhythmias are problems with the speed or rhythm of the heartbeat. The monitor is a small, portable device. You can wear one while you do your normal daily activities. This is usually used to diagnose what is causing palpitations/syncope (passing out).   Follow-Up: At [facility], you and your health needs are our priority.  As part of our continuing mission to provide you with exceptional heart care, our providers are all part of one team.  This team includes your primary Cardiologist (physician) and Advanced Practice Providers or APPs (Physician Assistants and Nurse Practitioners) who all work together to provide you with the care you need, when you need it.  Your next appointment:   As needed  Provider:   Lavonne Prairie, MD  We recommend signing up for the patient portal called MyChart.  Sign up information is provided on this After Visit Summary.  MyChart is used to connect with patients for Virtual Visits (Telemedicine).  Patients are able to view lab/test results, encounter notes, upcoming appointments, etc.  Non-urgent messages can be sent to  your provider as well.   To learn more about what you can do with MyChart, go to [email or web address].

## 2024-05-10 ENCOUNTER — Ambulatory Visit

## 2024-05-10 DIAGNOSIS — R002 Palpitations: Secondary | ICD-10-CM

## 2024-05-13 ENCOUNTER — Ambulatory Visit: Payer: Self-pay | Admitting: Cardiology

## 2024-05-13 DIAGNOSIS — R002 Palpitations: Secondary | ICD-10-CM | POA: Diagnosis not present
